# Patient Record
Sex: Male | Born: 1965 | Race: White | Hispanic: No | Marital: Married | Smoking: Never smoker
Health system: Southern US, Community
[De-identification: ages and names within clinical notes are randomized; demographics above are authoritative.]

## PROBLEM LIST (undated history)

## (undated) DIAGNOSIS — S82843A Displaced bimalleolar fracture of unspecified lower leg, initial encounter for closed fracture: Secondary | ICD-10-CM

## (undated) DIAGNOSIS — M1990 Unspecified osteoarthritis, unspecified site: Secondary | ICD-10-CM

## (undated) DIAGNOSIS — I10 Essential (primary) hypertension: Secondary | ICD-10-CM

## (undated) DIAGNOSIS — E7800 Pure hypercholesterolemia, unspecified: Secondary | ICD-10-CM

## (undated) HISTORY — PX: ELBOW SURGERY: SHX618

## (undated) HISTORY — PX: KNEE SURGERY: SHX244

---

## 1997-11-26 ENCOUNTER — Emergency Department (HOSPITAL_COMMUNITY): Admission: EM | Admit: 1997-11-26 | Discharge: 1997-11-26 | Payer: Self-pay

## 2015-11-05 DIAGNOSIS — S82843A Displaced bimalleolar fracture of unspecified lower leg, initial encounter for closed fracture: Secondary | ICD-10-CM

## 2015-11-05 HISTORY — DX: Displaced bimalleolar fracture of unspecified lower leg, initial encounter for closed fracture: S82.843A

## 2015-11-07 ENCOUNTER — Encounter (HOSPITAL_BASED_OUTPATIENT_CLINIC_OR_DEPARTMENT_OTHER): Payer: Self-pay | Admitting: *Deleted

## 2015-11-07 ENCOUNTER — Other Ambulatory Visit: Payer: Self-pay | Admitting: Orthopedic Surgery

## 2015-11-10 ENCOUNTER — Ambulatory Visit (HOSPITAL_BASED_OUTPATIENT_CLINIC_OR_DEPARTMENT_OTHER)
Admission: RE | Admit: 2015-11-10 | Discharge: 2015-11-10 | Disposition: A | Discharge status: Home / Self Care | Payer: BLUE CROSS/BLUE SHIELD | Source: Ambulatory Visit | Attending: Orthopedic Surgery | Admitting: Orthopedic Surgery

## 2015-11-10 ENCOUNTER — Ambulatory Visit (HOSPITAL_BASED_OUTPATIENT_CLINIC_OR_DEPARTMENT_OTHER): Payer: BLUE CROSS/BLUE SHIELD | Admitting: Anesthesiology

## 2015-11-10 ENCOUNTER — Encounter (HOSPITAL_BASED_OUTPATIENT_CLINIC_OR_DEPARTMENT_OTHER): Payer: Self-pay | Admitting: *Deleted

## 2015-11-10 ENCOUNTER — Encounter (HOSPITAL_BASED_OUTPATIENT_CLINIC_OR_DEPARTMENT_OTHER)
Admission: RE | Disposition: A | Discharge status: Home / Self Care | Payer: Self-pay | Source: Ambulatory Visit | Attending: Orthopedic Surgery

## 2015-11-10 DIAGNOSIS — S82851A Displaced trimalleolar fracture of right lower leg, initial encounter for closed fracture: Secondary | ICD-10-CM | POA: Diagnosis not present

## 2015-11-10 DIAGNOSIS — Z79899 Other long term (current) drug therapy: Secondary | ICD-10-CM | POA: Insufficient documentation

## 2015-11-10 DIAGNOSIS — S93432A Sprain of tibiofibular ligament of left ankle, initial encounter: Secondary | ICD-10-CM | POA: Insufficient documentation

## 2015-11-10 DIAGNOSIS — Z6833 Body mass index (BMI) 33.0-33.9, adult: Secondary | ICD-10-CM | POA: Diagnosis not present

## 2015-11-10 DIAGNOSIS — S82841A Displaced bimalleolar fracture of right lower leg, initial encounter for closed fracture: Secondary | ICD-10-CM | POA: Diagnosis present

## 2015-11-10 DIAGNOSIS — E7800 Pure hypercholesterolemia, unspecified: Secondary | ICD-10-CM | POA: Diagnosis not present

## 2015-11-10 DIAGNOSIS — I10 Essential (primary) hypertension: Secondary | ICD-10-CM | POA: Diagnosis not present

## 2015-11-10 DIAGNOSIS — E669 Obesity, unspecified: Secondary | ICD-10-CM | POA: Insufficient documentation

## 2015-11-10 DIAGNOSIS — X501XXA Overexertion from prolonged static or awkward postures, initial encounter: Secondary | ICD-10-CM | POA: Insufficient documentation

## 2015-11-10 HISTORY — DX: Pure hypercholesterolemia, unspecified: E78.00

## 2015-11-10 HISTORY — PX: ORIF ANKLE FRACTURE: SHX5408

## 2015-11-10 HISTORY — DX: Essential (primary) hypertension: I10

## 2015-11-10 HISTORY — DX: Unspecified osteoarthritis, unspecified site: M19.90

## 2015-11-10 HISTORY — PX: SYNDESMOSIS REPAIR: SHX5182

## 2015-11-10 HISTORY — DX: Displaced bimalleolar fracture of unspecified lower leg, initial encounter for closed fracture: S82.843A

## 2015-11-10 SURGERY — OPEN REDUCTION INTERNAL FIXATION (ORIF) ANKLE FRACTURE
Anesthesia: General | Site: Ankle | Laterality: Right

## 2015-11-10 MED ORDER — MIDAZOLAM HCL 2 MG/2ML IJ SOLN
INTRAMUSCULAR | Status: AC
  Filled 2015-11-10: qty 2

## 2015-11-10 MED ORDER — GLYCOPYRROLATE 0.2 MG/ML IJ SOLN
0.2000 mg | Freq: Once | INTRAMUSCULAR | Status: AC | PRN
  Administered 2015-11-10: 0.2 mg via INTRAVENOUS

## 2015-11-10 MED ORDER — LIDOCAINE HCL (CARDIAC) 20 MG/ML IV SOLN
INTRAVENOUS | Status: DC | PRN
  Administered 2015-11-10: 100 mg via INTRAVENOUS

## 2015-11-10 MED ORDER — 0.9 % SODIUM CHLORIDE (POUR BTL) OPTIME
TOPICAL | Status: DC | PRN
Start: 2015-11-10 — End: 2015-11-10
  Administered 2015-11-10: 250 mL

## 2015-11-10 MED ORDER — BUPIVACAINE-EPINEPHRINE (PF) 0.5% -1:200000 IJ SOLN
INTRAMUSCULAR | Status: DC | PRN
  Administered 2015-11-10 (×2): 20 mL via PERINEURAL

## 2015-11-10 MED ORDER — PROPOFOL 10 MG/ML IV BOLUS
INTRAVENOUS | Status: DC | PRN
  Administered 2015-11-10: 220 mg via INTRAVENOUS

## 2015-11-10 MED ORDER — SCOPOLAMINE 1 MG/3DAYS TD PT72: 1.0000 | MEDICATED_PATCH | Freq: Once | TRANSDERMAL | Status: DC | PRN

## 2015-11-10 MED ORDER — DEXAMETHASONE SODIUM PHOSPHATE 10 MG/ML IJ SOLN
INTRAMUSCULAR | Status: AC
  Filled 2015-11-10: qty 1

## 2015-11-10 MED ORDER — PROPOFOL 10 MG/ML IV BOLUS
INTRAVENOUS | Status: AC
  Filled 2015-11-10: qty 20

## 2015-11-10 MED ORDER — ONDANSETRON HCL 4 MG/2ML IJ SOLN
INTRAMUSCULAR | Status: DC | PRN
  Administered 2015-11-10: 4 mg via INTRAVENOUS

## 2015-11-10 MED ORDER — ONDANSETRON HCL 4 MG/2ML IJ SOLN
INTRAMUSCULAR | Status: AC
  Filled 2015-11-10: qty 2

## 2015-11-10 MED ORDER — LIDOCAINE HCL (CARDIAC) 20 MG/ML IV SOLN
INTRAVENOUS | Status: AC
  Filled 2015-11-10: qty 5

## 2015-11-10 MED ORDER — FENTANYL CITRATE (PF) 100 MCG/2ML IJ SOLN
50.0000 ug | INTRAMUSCULAR | Status: DC | PRN
  Administered 2015-11-10: 100 ug via INTRAVENOUS
  Administered 2015-11-10: 50 ug via INTRAVENOUS

## 2015-11-10 MED ORDER — CEFAZOLIN SODIUM-DEXTROSE 2-4 GM/100ML-% IV SOLN
2.0000 g | INTRAVENOUS | Status: AC
  Administered 2015-11-10: 2 g via INTRAVENOUS

## 2015-11-10 MED ORDER — DEXAMETHASONE SODIUM PHOSPHATE 4 MG/ML IJ SOLN
INTRAMUSCULAR | Status: DC | PRN
  Administered 2015-11-10: 10 mg via INTRAVENOUS

## 2015-11-10 MED ORDER — OXYCODONE HCL 5 MG PO TABS: 5.0000 mg | ORAL_TABLET | ORAL | Status: AC | PRN

## 2015-11-10 MED ORDER — CHLORHEXIDINE GLUCONATE 4 % EX LIQD: 60.0000 mL | Freq: Once | CUTANEOUS | Status: DC

## 2015-11-10 MED ORDER — NAPROXEN SODIUM 220 MG PO TABS: 440.0000 mg | ORAL_TABLET | Freq: Two times a day (BID) | ORAL | Status: AC

## 2015-11-10 MED ORDER — LACTATED RINGERS IV SOLN
INTRAVENOUS | Status: DC
  Administered 2015-11-10: 10 mL/h via INTRAVENOUS
  Administered 2015-11-10: 14:00:00 via INTRAVENOUS

## 2015-11-10 MED ORDER — ASPIRIN EC 81 MG PO TBEC: 81.0000 mg | DELAYED_RELEASE_TABLET | Freq: Two times a day (BID) | ORAL | Status: AC

## 2015-11-10 MED ORDER — FENTANYL CITRATE (PF) 100 MCG/2ML IJ SOLN
INTRAMUSCULAR | Status: AC
  Filled 2015-11-10: qty 2

## 2015-11-10 MED ORDER — MIDAZOLAM HCL 2 MG/2ML IJ SOLN
1.0000 mg | INTRAMUSCULAR | Status: DC | PRN
  Administered 2015-11-10: 2 mg via INTRAVENOUS
  Administered 2015-11-10: 1 mg via INTRAVENOUS

## 2015-11-10 MED ORDER — SODIUM CHLORIDE 0.9 % IV SOLN: INTRAVENOUS | Status: DC

## 2015-11-10 MED ORDER — CEFAZOLIN SODIUM-DEXTROSE 2-4 GM/100ML-% IV SOLN
INTRAVENOUS | Status: AC
  Filled 2015-11-10: qty 100

## 2015-11-10 SURGICAL SUPPLY — 89 items
BANDAGE ESMARK 6X9 LF (GAUZE/BANDAGES/DRESSINGS) ×1 IMPLANT
BIT DRILL 2.0 (BIT) ×2
BIT DRILL 2.0MM (BIT) ×1
BIT DRILL 2.5X2.75 QC CALB (BIT) ×2 IMPLANT
BIT DRILL 2.9 CANN QC NONSTRL (BIT) ×2 IMPLANT
BIT DRILL 2XNS DISP SS SM FRAG (BIT) IMPLANT
BIT DRILL CALIBRATED 2.7 (BIT) ×1 IMPLANT
BIT DRILL CALIBRATED 2.7MM (BIT) ×1
BIT DRL 2XNS DISP SS SM FRAG (BIT) ×1
BLADE SURG 15 STRL LF DISP TIS (BLADE) ×2 IMPLANT
BLADE SURG 15 STRL SS (BLADE) ×6
BNDG CMPR 9X4 STRL LF SNTH (GAUZE/BANDAGES/DRESSINGS)
BNDG CMPR 9X6 STRL LF SNTH (GAUZE/BANDAGES/DRESSINGS) ×1
BNDG COHESIVE 4X5 TAN STRL (GAUZE/BANDAGES/DRESSINGS) ×3 IMPLANT
BNDG COHESIVE 6X5 TAN STRL LF (GAUZE/BANDAGES/DRESSINGS) ×3 IMPLANT
BNDG ESMARK 4X9 LF (GAUZE/BANDAGES/DRESSINGS) IMPLANT
BNDG ESMARK 6X9 LF (GAUZE/BANDAGES/DRESSINGS) ×3
CANISTER SUCT 1200ML W/VALVE (MISCELLANEOUS) ×3 IMPLANT
CHLORAPREP W/TINT 26ML (MISCELLANEOUS) ×3 IMPLANT
COVER BACK TABLE 60X90IN (DRAPES) ×3 IMPLANT
CUFF TOURNIQUET SINGLE 34IN LL (TOURNIQUET CUFF) ×2 IMPLANT
DECANTER SPIKE VIAL GLASS SM (MISCELLANEOUS) IMPLANT
DRAPE EXTREMITY T 121X128X90 (DRAPE) ×3 IMPLANT
DRAPE OEC MINIVIEW 54X84 (DRAPES) ×3 IMPLANT
DRAPE U-SHAPE 47X51 STRL (DRAPES) ×3 IMPLANT
DRSG MEPITEL 4X7.2 (GAUZE/BANDAGES/DRESSINGS) ×3 IMPLANT
DRSG PAD ABDOMINAL 8X10 ST (GAUZE/BANDAGES/DRESSINGS) ×6 IMPLANT
ELECT REM PT RETURN 9FT ADLT (ELECTROSURGICAL) ×3
ELECTRODE REM PT RTRN 9FT ADLT (ELECTROSURGICAL) ×1 IMPLANT
GAUZE SPONGE 4X4 12PLY STRL (GAUZE/BANDAGES/DRESSINGS) ×3 IMPLANT
GLOVE BIO SURGEON STRL SZ8 (GLOVE) ×3 IMPLANT
GLOVE BIOGEL PI IND STRL 7.0 (GLOVE) IMPLANT
GLOVE BIOGEL PI IND STRL 8 (GLOVE) ×2 IMPLANT
GLOVE BIOGEL PI INDICATOR 7.0 (GLOVE) ×4
GLOVE BIOGEL PI INDICATOR 8 (GLOVE) ×2
GLOVE ECLIPSE 6.5 STRL STRAW (GLOVE) ×2 IMPLANT
GLOVE ECLIPSE 7.5 STRL STRAW (GLOVE) ×1 IMPLANT
GLOVE EXAM NITRILE MD LF STRL (GLOVE) IMPLANT
GOWN STRL REUS W/ TWL LRG LVL3 (GOWN DISPOSABLE) ×1 IMPLANT
GOWN STRL REUS W/ TWL XL LVL3 (GOWN DISPOSABLE) ×2 IMPLANT
GOWN STRL REUS W/TWL LRG LVL3 (GOWN DISPOSABLE) ×3
GOWN STRL REUS W/TWL XL LVL3 (GOWN DISPOSABLE) ×3
K-WIRE ACE 1.6X6 (WIRE) ×6
KWIRE ACE 1.6X6 (WIRE) IMPLANT
NEEDLE HYPO 22GX1.5 SAFETY (NEEDLE) IMPLANT
NS IRRIG 1000ML POUR BTL (IV SOLUTION) ×3 IMPLANT
PACK BASIN DAY SURGERY FS (CUSTOM PROCEDURE TRAY) ×3 IMPLANT
PAD CAST 4YDX4 CTTN HI CHSV (CAST SUPPLIES) ×1 IMPLANT
PADDING CAST ABS 4INX4YD NS (CAST SUPPLIES)
PADDING CAST ABS COTTON 4X4 ST (CAST SUPPLIES) IMPLANT
PADDING CAST COTTON 4X4 STRL (CAST SUPPLIES) ×3
PADDING CAST COTTON 6X4 STRL (CAST SUPPLIES) ×3 IMPLANT
PENCIL BUTTON HOLSTER BLD 10FT (ELECTRODE) ×3 IMPLANT
PLATE ACE 100DE 10H (Plate) ×2 IMPLANT
SANITIZER HAND PURELL 535ML FO (MISCELLANEOUS) ×3 IMPLANT
SCREW ACE CAN 4.0 40M (Screw) ×2 IMPLANT
SCREW ACE CAN 4.0 42M (Screw) ×2 IMPLANT
SCREW CORTICAL 2.7 MM 22MM (Screw) ×2 IMPLANT
SCREW CORTICAL 2.7MM  20MM (Screw) ×2 IMPLANT
SCREW CORTICAL 2.7MM 20MM (Screw) IMPLANT
SCREW CORTICAL 3.5MM  16MM (Screw) ×10 IMPLANT
SCREW CORTICAL 3.5MM  55MM (Screw) ×2 IMPLANT
SCREW CORTICAL 3.5MM  60MM (Screw) ×2 IMPLANT
SCREW CORTICAL 3.5MM 16MM (Screw) IMPLANT
SCREW CORTICAL 3.5MM 18MM (Screw) ×2 IMPLANT
SCREW CORTICAL 3.5MM 55MM (Screw) IMPLANT
SCREW CORTICAL 3.5MM 60MM (Screw) IMPLANT
SHEET MEDIUM DRAPE 40X70 STRL (DRAPES) ×3 IMPLANT
SLEEVE SCD COMPRESS KNEE MED (MISCELLANEOUS) ×3 IMPLANT
SPLINT FAST PLASTER 5X30 (CAST SUPPLIES) ×40
SPLINT PLASTER CAST FAST 5X30 (CAST SUPPLIES) ×20 IMPLANT
SPONGE LAP 18X18 X RAY DECT (DISPOSABLE) ×3 IMPLANT
STOCKINETTE 6  STRL (DRAPES) ×2
STOCKINETTE 6 STRL (DRAPES) ×1 IMPLANT
SUCTION FRAZIER HANDLE 10FR (MISCELLANEOUS) ×2
SUCTION TUBE FRAZIER 10FR DISP (MISCELLANEOUS) ×1 IMPLANT
SUT ETHILON 3 0 PS 1 (SUTURE) ×3 IMPLANT
SUT FIBERWIRE #2 38 T-5 BLUE (SUTURE)
SUT MNCRL AB 3-0 PS2 18 (SUTURE) ×3 IMPLANT
SUT VIC AB 0 SH 27 (SUTURE) IMPLANT
SUT VIC AB 2-0 SH 27 (SUTURE) ×3
SUT VIC AB 2-0 SH 27XBRD (SUTURE) IMPLANT
SUTURE FIBERWR #2 38 T-5 BLUE (SUTURE) IMPLANT
SYR BULB 3OZ (MISCELLANEOUS) ×3 IMPLANT
SYR CONTROL 10ML LL (SYRINGE) IMPLANT
TOWEL OR 17X24 6PK STRL BLUE (TOWEL DISPOSABLE) ×6 IMPLANT
TUBE CONNECTING 20'X1/4 (TUBING) ×1
TUBE CONNECTING 20X1/4 (TUBING) ×2 IMPLANT
UNDERPAD 30X30 (UNDERPADS AND DIAPERS) ×3 IMPLANT

## 2015-11-10 NOTE — Progress Notes (Signed)
Assisted Dr. Hodierne with right, ultrasound guided, popliteal, adductor canal block. Side rails up, monitors on throughout procedure. See vital signs in flow sheet. Tolerated Procedure well. 

## 2015-11-10 NOTE — Brief Op Note (Signed)
11/10/2015  2:43 PM  PATIENT:  Redmond SchoolJeffrey C Woodrick  50 y.o. male  PRE-OPERATIVE DIAGNOSIS:  Right ankle bimalleolar fracture and syndesmosis disruption  POST-OPERATIVE DIAGNOSIS:  Right ankle trimalleolar fracture and syndesmosis disruption  Procedure(s): 1.  OPEN REDUCTION INTERNAL FIXATION (ORIF) RIGHT ANKLE trimalleolar FRACTURE without fixation of the posterior lip 2.  ORIF right ankle syndesmosis 3.  Stress exam of right ankle under fluoro 4.  AP, mortise and lateral xrays of the right ankle  SURGEON:  Toni ArthursJohn Moniqua Engebretsen, MD  ASSISTANT:  n/a  ANESTHESIA:   General, regional  EBL:  minimal   TOURNIQUET:   Total Tourniquet Time Documented: Thigh (Right) - 60 minutes Total: Thigh (Right) - 60 minutes  COMPLICATIONS:  None apparent  DISPOSITION:  Extubated, awake and stable to recovery.  DICTATION ID:  [id number]

## 2015-11-10 NOTE — Op Note (Signed)
NAMBritt Walsh:  Walsh, Carlos             ACCOUNT NO.:  [id number]  MEDICAL RECORD NO.:  [id number]  LOCATION:                                 FACILITY:  PHYSICIAN:  Carlos ArthursJohn Quintasia Theroux, MD             DATE OF BIRTH:  DATE OF PROCEDURE:  11/10/2015 DATE OF DISCHARGE:                              OPERATIVE REPORT   PREOPERATIVE DIAGNOSIS:  Right ankle bimalleolar fracture and syndesmosis disruption.  POSTOPERATIVE DIAGNOSIS:  Right ankle trimalleolar fracture and syndesmosis disruption.  PROCEDURE: 1. Open reduction and internal fixation of right ankle trimalleolar     fracture without fixation of the posterior lip. 2. Open reduction and internal fixation of right ankle syndesmosis. 3. Stress examination of right ankle under fluoroscopy. 4. AP, mortise, and lateral radiographs of the right ankle.  SURGEON:  Carlos ArthursJohn Oreta Soloway, MD.  ASSISTANT:  None.  ANESTHESIA:  General, regional.  ESTIMATED BLOOD LOSS:  Minimal.  TOURNIQUET TIME:  60 minutes at 250 mmHg.  COMPLICATIONS:  None apparent.  DISPOSITION:  Extubated, awake, and stable to recovery.  INDICATION FOR PROCEDURE:  The patient is a 50 year old male, who injured his ankle a week ago when he rolled his ankle on a border to a bed in a friend's yard.  He was found in the emergency room to have a displaced bimalleolar ankle fracture.  He underwent closed reduction and followed up at my office.  His radiographs show a displaced bimalleolar fracture with an unstable syndesmosis.  This is a Weber C fracture of the distal fibula.  He presents today for operative treatment of this unstable displaced injury.  He understands the risks and benefits, the alternative treatment options, and elects surgical treatment.  He specifically understands risks of bleeding, infection, nerve damage, blood clots, need for additional surgery, continued pain, nonunion, amputation, and death.  PROCEDURE IN DETAIL:  After preoperative consent was obtained and  the correct operative site was identified, the patient was brought to the operating room and placed supine on the operating table.  General anesthesia was induced.  Preoperative antibiotics were administered. Surgical time-out was taken.  The right lower extremity was prepped and draped in standard sterile fashion with tourniquet around the thigh. Extremity was exsanguinated and the tourniquet was inflated to 250 mmHg. A longitudinal incision was made over the lateral malleolus.  Sharp dissection was carried down through the skin and subcutaneous tissue. The fracture site was identified.  There was comminution with a large butterfly fragment displaced posteriorly.  The butterfly fragment was then reduced to the proximal fibula and held with a lobster claw.  A 2.7 mm lag screw from the [employer] small frag set was then inserted from anterior to posterior and it was noted to compress the fracture site appropriately.  The distal fragment of the fibula was then reduced to the 2 proximal fragments and again held with a lobster claw clamp. Radiographs revealed appropriate reduction.  Again, a 2.7 mm lag screw was inserted from anterior to posterior across the fracture site.  This was noted to have excellent purchase and compressed the fracture site appropriately.  A 10-hole one-third tubular plate was then applied across the fracture  site.  It was fixed proximal to the fracture with 3 bicortical screws and distally with 3 bicortical screws.  AP and lateral radiographs again confirmed appropriate reduction of fracture and appropriate position and length of the hardware. Attention was then turned to the medial side of the ankle.  A curvilinear incision was made over the medial malleolus anterior and well away from the posteromedial fracture blisters.  Blunt dissection was carried down through the subcutaneous tissues of the fracture site. The fracture was cleaned of all hematoma and irrigated  copiously.  The medial malleolus fracture was reduced and held with a pointed tenaculum. Two guide pins were inserted.  Radiographs confirmed appropriate position of the guide pins.  They were used to insert two 4 mm cannulated partially-threaded screws.  Both showed excellent purchase and compressed the fracture site appropriately.  A mortise view was then obtained.  Dorsiflexion and external rotation stress was applied.  The syndesmosis was noted to widen.  Decision was made at that time to proceed with open reduction and internal fixation of the syndesmosis disruption.  A King [place] clamp was then applied across the syndesmosis with the ankle held at neutral dorsiflexion.  Two 3.5 mm fully-threaded screws were inserted across all 4 cortices of the distal fibula and tibia.  AP and lateral radiographs confirmed appropriate reduction of the syndesmosis and appropriate position and length of all hardware.  Both wounds were then irrigated copiously. Deep subcutaneous tissues were approximated with inverted simple sutures of 2-0 Vicryl.  Superficial subcutaneous tissues were approximated with inverted simple sutures of 3-0 Monocryl, running 3-0 nylon were used to close the medial and lateral incisions.  Sterile dressings were applied followed by well-padded short-leg splint.  Tourniquet was released at 59 minutes after application of the dressings.  The patient was awakened from anesthesia and transported to the recovery room in stable condition.  FOLLOWUP PLAN:  The patient will be nonweightbearing on the right lower extremity.  He will follow up with me in the office in 2 weeks for suture removal and conversion to a short-leg cast.  I anticipate 4-6 weeks nonweightbearing.  He will take aspirin for DVT prophylaxis.  RADIOGRAPHS:  AP, mortise, and lateral radiographs of the right ankle as well as stress views of the right ankle are obtained today intraoperatively.  These show interval  reduction and fixation of the right ankle trimalleolar fracture.  Hardware is appropriately positioned and of the appropriate length.  No other acute injuries are noted.     Carlos Arthurs, MD     JH/MEDQ  D:  11/10/2015  T:  11/10/2015  Job:  [id number]

## 2015-11-10 NOTE — Transfer of Care (Signed)
Immediate Anesthesia Transfer of Care Note  Patient: Carlos Walsh  Procedure(s) Performed: Procedure(s): OPEN REDUCTION INTERNAL FIXATION (ORIF) RIGHT ANKLE BIMALLEOLAR  FRACTURE  (Right) SYNDESMOSIS REPAIR (Right)  Patient Location: PACU  Anesthesia Type:GA combined with regional for post-op pain  Level of Consciousness: awake, sedated and pateint uncooperative  Airway & Oxygen Therapy: Patient Spontanous Breathing  Post-op Assessment: Report given to RN and Post -op Vital signs reviewed and stable  Post vital signs: Reviewed and stable  Last Vitals:  Filed Vitals:   11/10/15 1245 11/10/15 1250  BP: 133/82   Pulse: 96 110  Temp:    Resp: 13 19    Complications: No apparent anesthesia complications

## 2015-11-10 NOTE — Discharge Instructions (Addendum)
Toni ArthursJohn Hewitt, MD [facility]  Please read the following information regarding your care after surgery.  Medications  You only need a prescription for the narcotic pain medicine (ex. oxycodone, Percocet, Norco).  All of the other medicines listed below are available over the counter. X acetominophen (Tylenol) 650 mg every 4-6 hours as you need for minor pain X oxycodone as prescribed for moderate to severe pain X naprosyn (Aleve) 2 pills twice a day for 3-5 days after surgery   Narcotic pain medicine (ex. oxycodone, Percocet, Vicodin) will cause constipation.  To prevent this problem, take the following medicines while you are taking any pain medicine. X docusate sodium (Colace) 100 mg twice a day X senna (Senokot) 2 tablets twice a day  X To help prevent blood clots, take a baby aspirin (81 mg) twice a day for two weeks after surgery.  You should also get up every hour while you are awake to move around.    Weight Bearing ? Bear weight when you are able on your operated leg or foot. ? Bear weight only on the heel of your operated foot in the post-op shoe. X Do not bear any weight on the operated leg or foot.  Cast / Splint / Dressing X Keep your splint or cast clean and dry.  Dont put anything (coat hanger, pencil, etc) down inside of it.  If it gets damp, use a hair dryer on the cool setting to dry it.  If it gets soaked, call the office to schedule an appointment for a cast change. ? Remove your dressing 3 days after surgery and cover the incisions with dry dressings.    After your dressing, cast or splint is removed; you may shower, but do not soak or scrub the wound.  Allow the water to run over it, and then gently pat it dry.  Swelling It is normal for you to have swelling where you had surgery.  To reduce swelling and pain, keep your toes above your nose for at least 3 days after surgery.  It may be necessary to keep your foot or leg elevated for several weeks.  If it  hurts, it should be elevated.  Follow Up Call my office at [phone] when you are discharged from the hospital or surgery center to schedule an appointment to be seen two weeks after surgery.  Call my office at [phone] if you develop a fever >101.5 F, nausea, vomiting, bleeding from the surgical site or severe pain.     Regional Anesthesia Blocks  1. Numbness or the inability to move the "blocked" extremity may last from 3-48 hours after placement. The length of time depends on the medication injected and your individual response to the medication. If the numbness is not going away after 48 hours, call your surgeon.  2. The extremity that is blocked will need to be protected until the numbness is gone and the  Strength has returned. Because you cannot feel it, you will need to take extra care to avoid injury. Because it may be weak, you may have difficulty moving it or using it. You may not know what position it is in without looking at it while the block is in effect.  3. For blocks in the legs and feet, returning to weight bearing and walking needs to be done carefully. You will need to wait until the numbness is entirely gone and the strength has returned. You should be able to move your leg and foot normally before  you try and bear weight or walk. You will need someone to be with you when you first try to ensure you do not fall and possibly risk injury.  4. Bruising and tenderness at the needle site are common side effects and will resolve in a few days.  5. Persistent numbness or new problems with movement should be communicated to the surgeon or the [facility] [phone] [facility] [phone]).     Post Anesthesia Home Care Instructions  Activity: Get plenty of rest for the remainder of the day. A responsible adult should stay with you for 24 hours following the procedure.  For the next 24 hours, DO NOT: -Drive a car -Social worker -Drink alcoholic beverages -Take any medication unless instructed by your physician -Make any legal decisions or sign important papers.  Meals: Start with liquid foods such as gelatin or soup. Progress to regular foods as tolerated. Avoid greasy, spicy, heavy foods. If nausea and/or vomiting occur, drink only clear liquids until the nausea and/or vomiting subsides. Call your physician if vomiting continues.  Special Instructions/Symptoms: Your throat may feel dry or sore from the anesthesia or the breathing tube placed in your throat during surgery. If this causes discomfort, gargle with warm salt water. The discomfort should disappear within 24 hours.  If you had a scopolamine patch placed behind your ear for the management of post- operative nausea and/or vomiting:  1. The medication in the patch is effective for 72 hours, after which it should be removed.  Wrap patch in a tissue and discard in the trash. Wash hands thoroughly with soap and water. 2. You may remove the patch earlier than 72 hours if you experience unpleasant side effects which may include dry mouth, dizziness or visual disturbances. 3. Avoid touching the patch. Wash your hands with soap and water after contact with the patch.

## 2015-11-10 NOTE — Anesthesia Procedure Notes (Signed)
Anesthesia Regional Block:  Popliteal block  Pre-Anesthetic Checklist: ,, timeout performed, Correct Patient, Correct Site, Correct Laterality, Correct Procedure, Correct Position, site marked, Risks and benefits discussed,  Surgical consent,  Pre-op evaluation,  At surgeon's request and post-op pain management  Laterality: Right  Prep: chloraprep       Needles:  Injection technique: Single-shot  Needle Type: Echogenic Stimulator Needle     Needle Length: 9cm 9 cm Needle Gauge: 21 and 21 G    Additional Needles:  Procedures: ultrasound guided (picture in chart) and nerve stimulator Popliteal block  Nerve Stimulator or Paresthesia:  Response: plantar flexion of foot, 0.45 mA,   Additional Responses:   Narrative:  Start time: 11/10/2015 12:20 PM End time: 11/10/2015 12:32 PM Injection made incrementally with aspirations every 5 mL.  Performed by: Personally  Anesthesiologist: Fabianna Keats  Additional Notes: Functioning IV was confirmed and monitors were applied.  A 90mm 21ga Arrow echogenic stimulator needle was used. Sterile prep and drape,hand hygiene and sterile gloves were used.  Negative aspiration and negative test dose prior to incremental administration of local anesthetic. The patient tolerated the procedure well.  Ultrasound guidance: relevent anatomy identified, needle position confirmed, local anesthetic spread visualized around nerve(s), vascular puncture avoided.  Image printed for medical record.   Right adductor canal block also performed.  Sterile prep and drape.  Ultrasound guidance.  20 mL of 0.5% bupivacaine +epi was injected incrementally.  Pt tolerated the procedure well.

## 2015-11-10 NOTE — Anesthesia Preprocedure Evaluation (Signed)
Anesthesia Evaluation  Patient identified by MRN, date of birth, ID band Patient awake    Reviewed: Allergy & Precautions, H&P , NPO status , Patient's Chart, lab work & pertinent test results  Airway Mallampati: II  TM Distance: >3 FB Neck ROM: full    Dental no notable dental hx. (+) Teeth Intact   Pulmonary neg pulmonary ROS,    Pulmonary exam normal        Cardiovascular hypertension, Pt. on medications Normal cardiovascular exam     Neuro/Psych negative neurological ROS  negative psych ROS   GI/Hepatic negative GI ROS, Neg liver ROS,   Endo/Other  negative endocrine ROS  Renal/GU negative Renal ROS     Musculoskeletal   Abdominal (+) + obese,   Peds  Hematology negative hematology ROS (+)   Anesthesia Other Findings   Reproductive/Obstetrics negative OB ROS                             Anesthesia Physical Anesthesia Plan  ASA: II  Anesthesia Plan: General   Post-op Pain Management: GA combined w/ Regional for post-op pain   Induction: Intravenous  Airway Management Planned: LMA  Additional Equipment:   Intra-op Plan:   Post-operative Plan:   Informed Consent: I have reviewed the patients History and Physical, chart, labs and discussed the procedure including the risks, benefits and alternatives for the proposed anesthesia with the patient or authorized representative who has indicated his/her understanding and acceptance.   History available from chart only  Plan Discussed with: CRNA and Surgeon  Anesthesia Plan Comments:         Anesthesia Quick Evaluation

## 2015-11-10 NOTE — H&P (Addendum)
Redmond SchoolJeffrey C Walsh is an 50 y.o. male.   Chief Complaint: right ankle injury HPI: 50 y/o male with right ankle fracture and syndesmosis disruption after a fall last weekend.  He presents now for operative treatment of the displaced and unstable ankle injury.  Past Medical History  Diagnosis Date  . Arthritis     left knee  . Hypertension     states under control with med., has been on med. x 2 yr.  . High cholesterol   . Bimalleolar ankle fracture 11/05/2015    right    Past Surgical History  Procedure Laterality Date  . Knee surgery      as a teenager  . Elbow surgery Right     x 2    History reviewed. No pertinent family history. Social History:  reports that he has never smoked. His smokeless tobacco use includes Snuff. He reports that he drinks alcohol. He reports that he does not use illicit drugs.  Allergies: No Known Allergies  Medications Prior to Admission  Medication Sig Dispense Refill  . losartan (COZAAR) 25 MG tablet Take 50 mg by mouth daily.     Marland Kitchen. oxyCODONE-acetaminophen (PERCOCET/ROXICET) 5-325 MG tablet Take by mouth every 4 (four) hours as needed for severe pain.    . simvastatin (ZOCOR) 20 MG tablet Take 20 mg by mouth daily.      No results found for this or any previous visit (from the past 48 hour(s)). No results found.  ROS  No recent f/c/n/v/wt loss  Blood pressure 146/93, temperature 97.8 F (36.6 C), temperature source Oral, resp. rate 13, height 5\' 9"  (1.753 m), weight 103.42 kg (228 lb), SpO2 96 %. Physical Exam  wn wd male in nad.  A and O x 4.  Mood and affect normal.  EOMI.  resp unlabored.  R ankle with moderate swelling.  Small blisters medially.  No lymphadenopathy.  5/5 strength in PF and DF of the ankle and toes.  Sens to LT intact at the forefoot.  Brisk cap refill at the toes.  Assessment/Plan R ankle bimal fracture and syndesmosis disruption.  To OR for ORIF of ankle fracture and syndesmosis.  The risks and benefits of the alternative  treatment options have been discussed in detail.  The patient wishes to proceed with surgery and specifically understands risks of bleeding, infection, nerve damage, blood clots, need for additional surgery, amputation and death.   Carlos Walsh, Carlos Badal, MD 11/10/2015, 12:37 PM

## 2015-11-10 NOTE — Progress Notes (Signed)
Large blister noted right ankle after dressing taken off/ dr hewitt evaluated and will proceed with surgery

## 2015-11-10 NOTE — Anesthesia Postprocedure Evaluation (Signed)
Anesthesia Post Note  Patient: Redmond SchoolJeffrey C Corcino  Procedure(s) Performed: Procedure(s) (LRB): OPEN REDUCTION INTERNAL FIXATION (ORIF) RIGHT ANKLE BIMALLEOLAR  FRACTURE  (Right) SYNDESMOSIS REPAIR (Right)  Patient location during evaluation: PACU Anesthesia Type: General Level of consciousness: awake Pain management: pain level controlled Vital Signs Assessment: post-procedure vital signs reviewed and stable Respiratory status: spontaneous breathing Cardiovascular status: stable Postop Assessment: no signs of nausea or vomiting Anesthetic complications: no    Last Vitals:  Filed Vitals:   11/10/15 1445 11/10/15 1500  BP: 114/76 123/84  Pulse: 100 88  Temp:    Resp: 11 13    Last Pain:  Filed Vitals:   11/10/15 1519  PainSc: 0-No pain                 Tramon Crescenzo JR,JOHN Jes Costales

## 2015-11-11 ENCOUNTER — Encounter (HOSPITAL_BASED_OUTPATIENT_CLINIC_OR_DEPARTMENT_OTHER): Payer: Self-pay | Admitting: Orthopedic Surgery

## 2021-12-13 ENCOUNTER — Other Ambulatory Visit: Payer: Self-pay | Admitting: Physician Assistant

## 2021-12-13 DIAGNOSIS — N6001 Solitary cyst of right breast: Secondary | ICD-10-CM

## 2021-12-29 ENCOUNTER — Ambulatory Visit
Admission: RE | Admit: 2021-12-29 | Discharge: 2021-12-29 | Disposition: A | Discharge status: Home / Self Care | Payer: 59 | Source: Ambulatory Visit | Attending: Physician Assistant | Admitting: Physician Assistant

## 2021-12-29 ENCOUNTER — Ambulatory Visit: Payer: 59

## 2021-12-29 DIAGNOSIS — N6001 Solitary cyst of right breast: Secondary | ICD-10-CM

## 2022-08-22 ENCOUNTER — Other Ambulatory Visit: Payer: Self-pay | Admitting: Physician Assistant

## 2022-08-22 DIAGNOSIS — R7401 Elevation of levels of liver transaminase levels: Secondary | ICD-10-CM

## 2022-08-31 ENCOUNTER — Ambulatory Visit (HOSPITAL_COMMUNITY)
Admission: RE | Admit: 2022-08-31 | Discharge: 2022-08-31 | Disposition: A | Discharge status: Home / Self Care | Payer: 59 | Source: Ambulatory Visit | Attending: Physician Assistant | Admitting: Physician Assistant

## 2022-08-31 ENCOUNTER — Other Ambulatory Visit: Payer: 59

## 2022-08-31 DIAGNOSIS — R7401 Elevation of levels of liver transaminase levels: Secondary | ICD-10-CM | POA: Insufficient documentation

## 2022-11-15 DIAGNOSIS — H0014 Chalazion left upper eyelid: Secondary | ICD-10-CM | POA: Diagnosis not present

## 2023-01-04 DIAGNOSIS — Z1331 Encounter for screening for depression: Secondary | ICD-10-CM | POA: Diagnosis not present

## 2023-01-04 DIAGNOSIS — E785 Hyperlipidemia, unspecified: Secondary | ICD-10-CM | POA: Diagnosis not present

## 2023-01-04 DIAGNOSIS — I10 Essential (primary) hypertension: Secondary | ICD-10-CM | POA: Diagnosis not present

## 2023-01-04 DIAGNOSIS — E291 Testicular hypofunction: Secondary | ICD-10-CM | POA: Diagnosis not present

## 2023-01-04 DIAGNOSIS — G4700 Insomnia, unspecified: Secondary | ICD-10-CM | POA: Diagnosis not present

## 2023-05-28 IMAGING — MG DIGITAL DIAGNOSTIC BILAT W/ TOMO W/ CAD
6 of 10 series · 6 of 30 positions shown · non-contrast
Comparison: None.

CLINICAL DATA: 55-year-old male presenting for evaluation of a lump
in the right breast for several months. History of breast cancer the
patient's mother.

EXAM:
DIGITAL DIAGNOSTIC BILATERAL MAMMOGRAM WITH TOMOSYNTHESIS AND CAD
TECHNIQUE: Bilateral digital diagnostic mammography and breast tomosynthesis
was performed. The images were evaluated with computer-aided
detection.

[R MLO synth-2D]
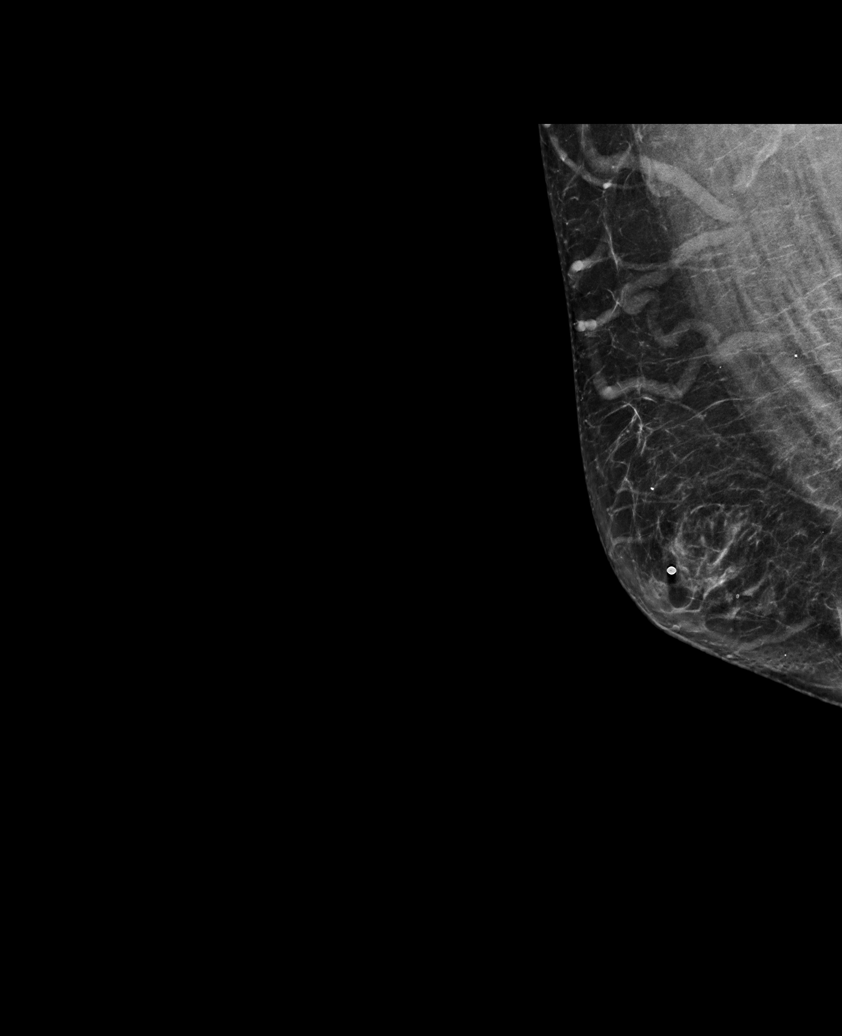

[L MLO synth-2D]
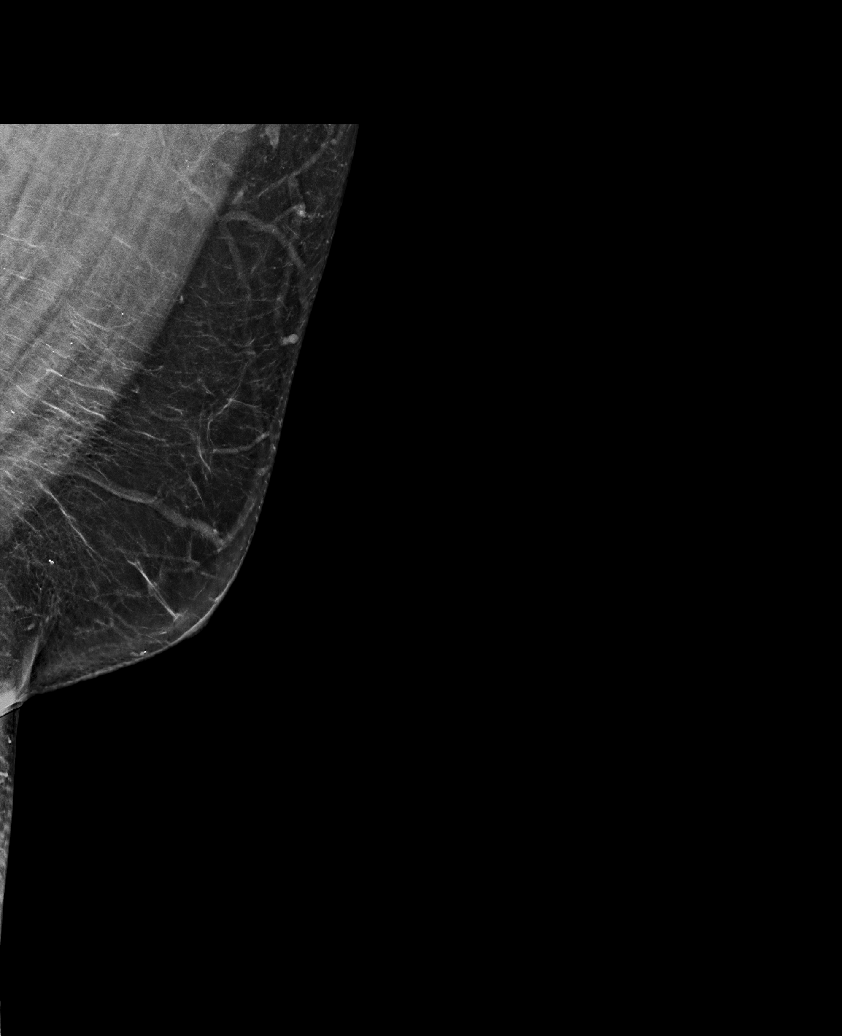

[R CC synth-2D (1 of 2)]
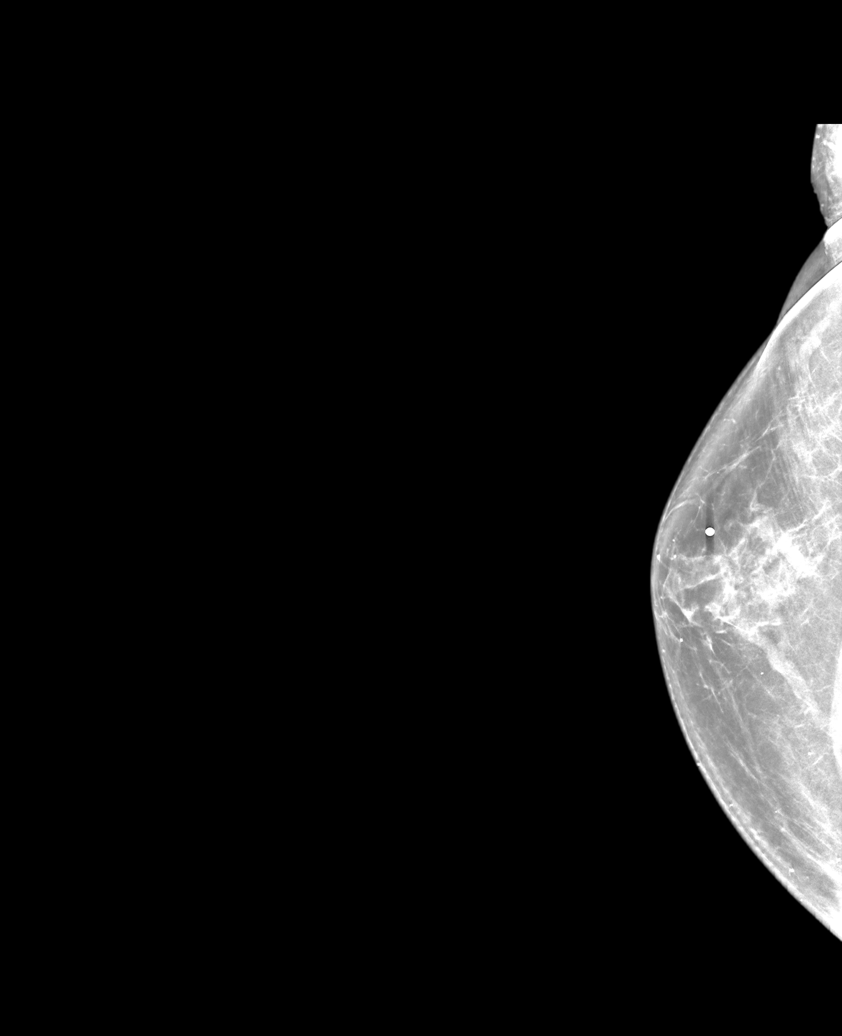

[L CC synth-2D]
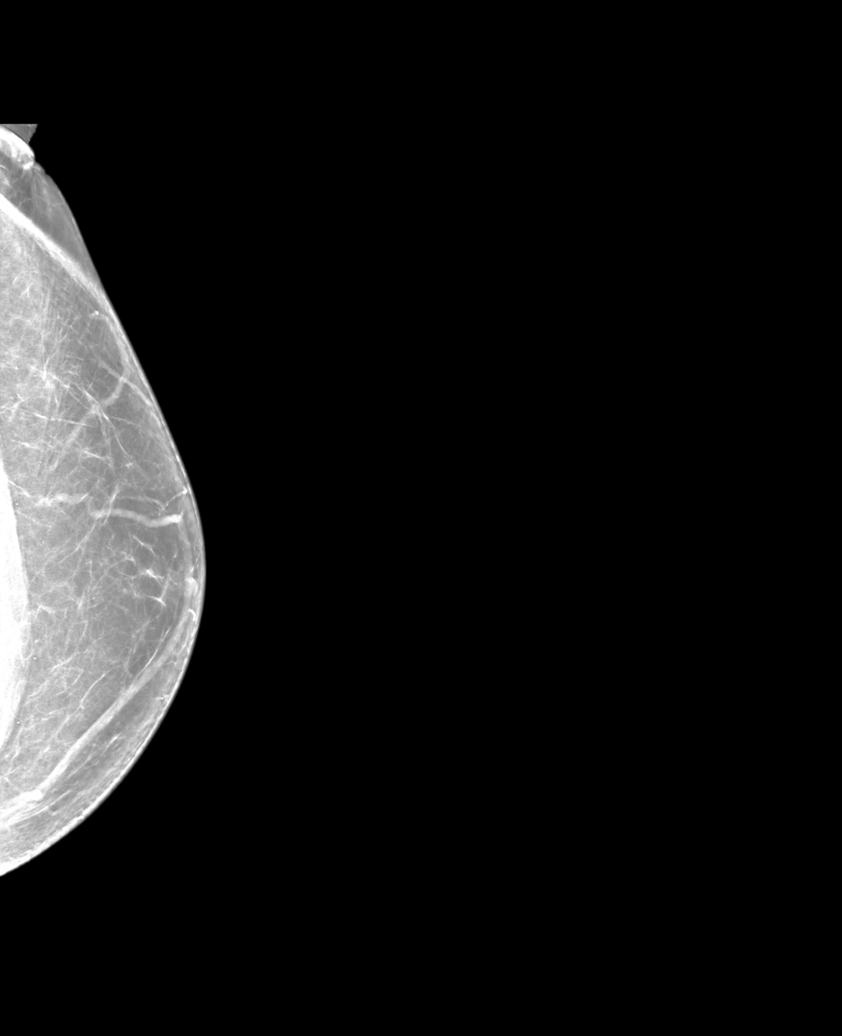

[R CC synth-2D (2 of 2)]
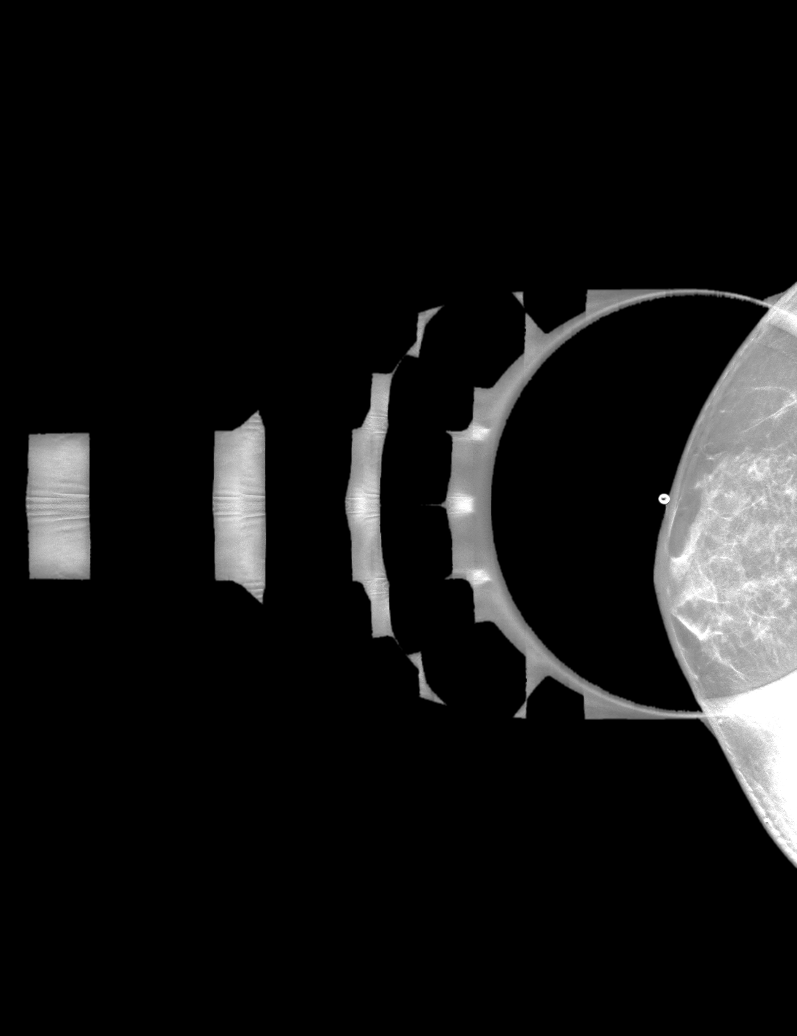

[R MLO tomo · tomo slice 35/68.0]
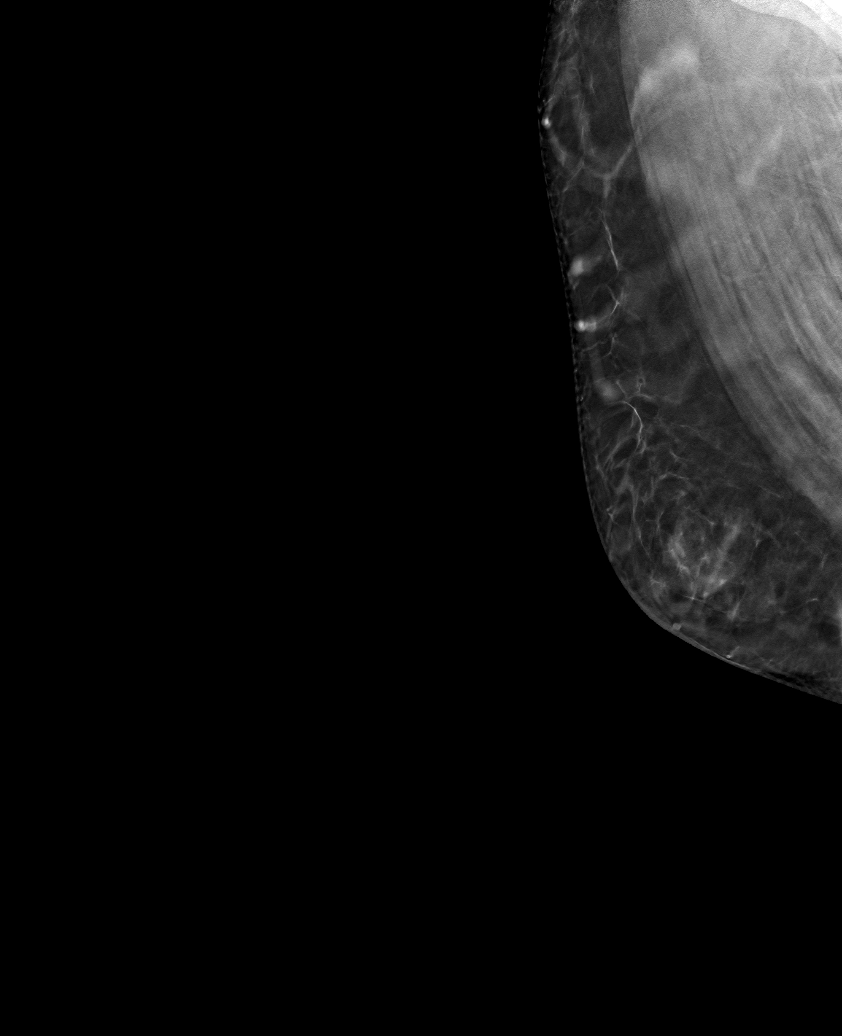

[6 of 30 positions shown; findings below may reference images not displayed]

ACR Breast Density Category b: There are scattered areas of
fibroglandular density.
FINDINGS: Right breast: There is a moderate amount of fibroglandular tissue in
the retroareolar aspect of the right breast at the palpable site of
concern marked by a skin BB. A spot tangential view of this area was
performed in addition to standard views. There is no suspicious
mass, distortion or microcalcification.

Left breast: No suspicious mass, distortion, or microcalcifications
are identified to suggest presence of malignancy.

On physical exam of the right breast at the palpable site of concern
I feel a soft fullness without a fixed discrete mass.
IMPRESSION: Benign asymmetric right breast gynecomastia.

RECOMMENDATION:
Clinical follow-up.

I discussed with the patient the fact that gynecomastia can occur in
men as testosterone levels decrease with age or in younger men with
low testosterone levels, causing a change in the serum
testosterone:estrogen ratio. We also discussed other potential
etiologies of gynecomastia including numerous prescription
medications and recreational drugs (marijuana and anabolic steroids
in particular). Approximately 20% of cases of gynecomastia are
idiopathic.

I have discussed the findings and recommendations with the patient.
If applicable, a reminder letter will be sent to the patient
regarding the next appointment.

BI-RADS CATEGORY  2: Benign.
